# Patient Record
Sex: Male | Born: 1980 | Race: Black or African American | Hispanic: No
Health system: Southern US, Community
[De-identification: ages and names within clinical notes are randomized; demographics above are authoritative.]

## PROBLEM LIST (undated history)

## (undated) HISTORY — PX: HERNIA REPAIR: SHX51

---

## 2011-08-25 ENCOUNTER — Ambulatory Visit: Payer: Worker's Compensation

## 2011-08-31 ENCOUNTER — Ambulatory Visit: Payer: Worker's Compensation

## 2011-09-07 ENCOUNTER — Ambulatory Visit: Payer: Worker's Compensation

## 2011-11-30 ENCOUNTER — Other Ambulatory Visit: Payer: Self-pay | Admitting: Family Medicine

## 2011-11-30 LAB — HEPATITIS B SURFACE ANTIGEN: Hepatitis B Surface Ag: NEGATIVE

## 2011-12-06 ENCOUNTER — Encounter: Payer: Self-pay | Admitting: Family Medicine

## 2012-01-23 ENCOUNTER — Ambulatory Visit
Admission: RE | Admit: 2012-01-23 | Discharge: 2012-01-23 | Disposition: A | Discharge status: Home / Self Care | Payer: 59 | Source: Ambulatory Visit | Attending: Family Medicine | Admitting: Family Medicine

## 2012-01-23 ENCOUNTER — Other Ambulatory Visit: Payer: Self-pay | Admitting: Family Medicine

## 2012-01-23 DIAGNOSIS — S63509A Unspecified sprain of unspecified wrist, initial encounter: Secondary | ICD-10-CM

## 2013-05-30 IMAGING — CR DG WRIST COMPLETE 3+V*R*
2 series · 2 of 2 positions shown · non-contrast
Comparison: None

CLINICAL DATA: Right wrist pain.

RIGHT WRIST - COMPLETE 3+ VIEW

[view not recorded (1 of 2)]
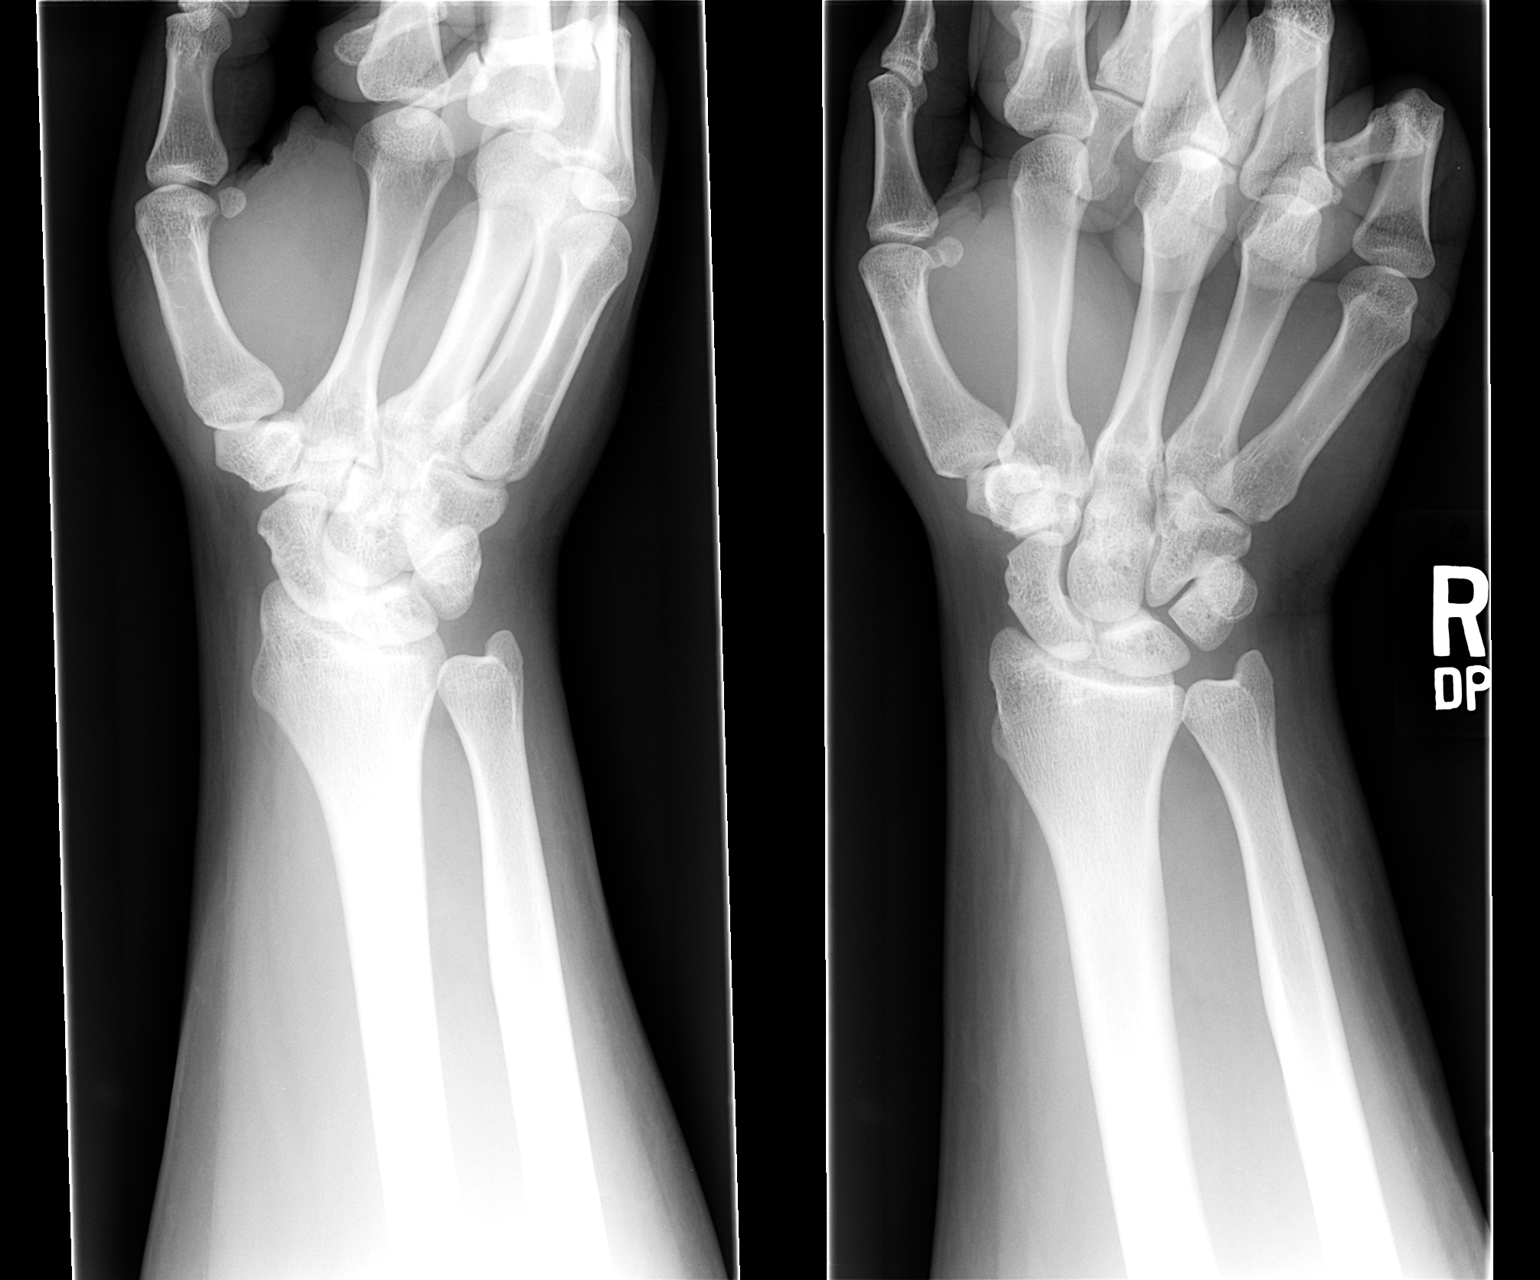

[view not recorded (2 of 2)]
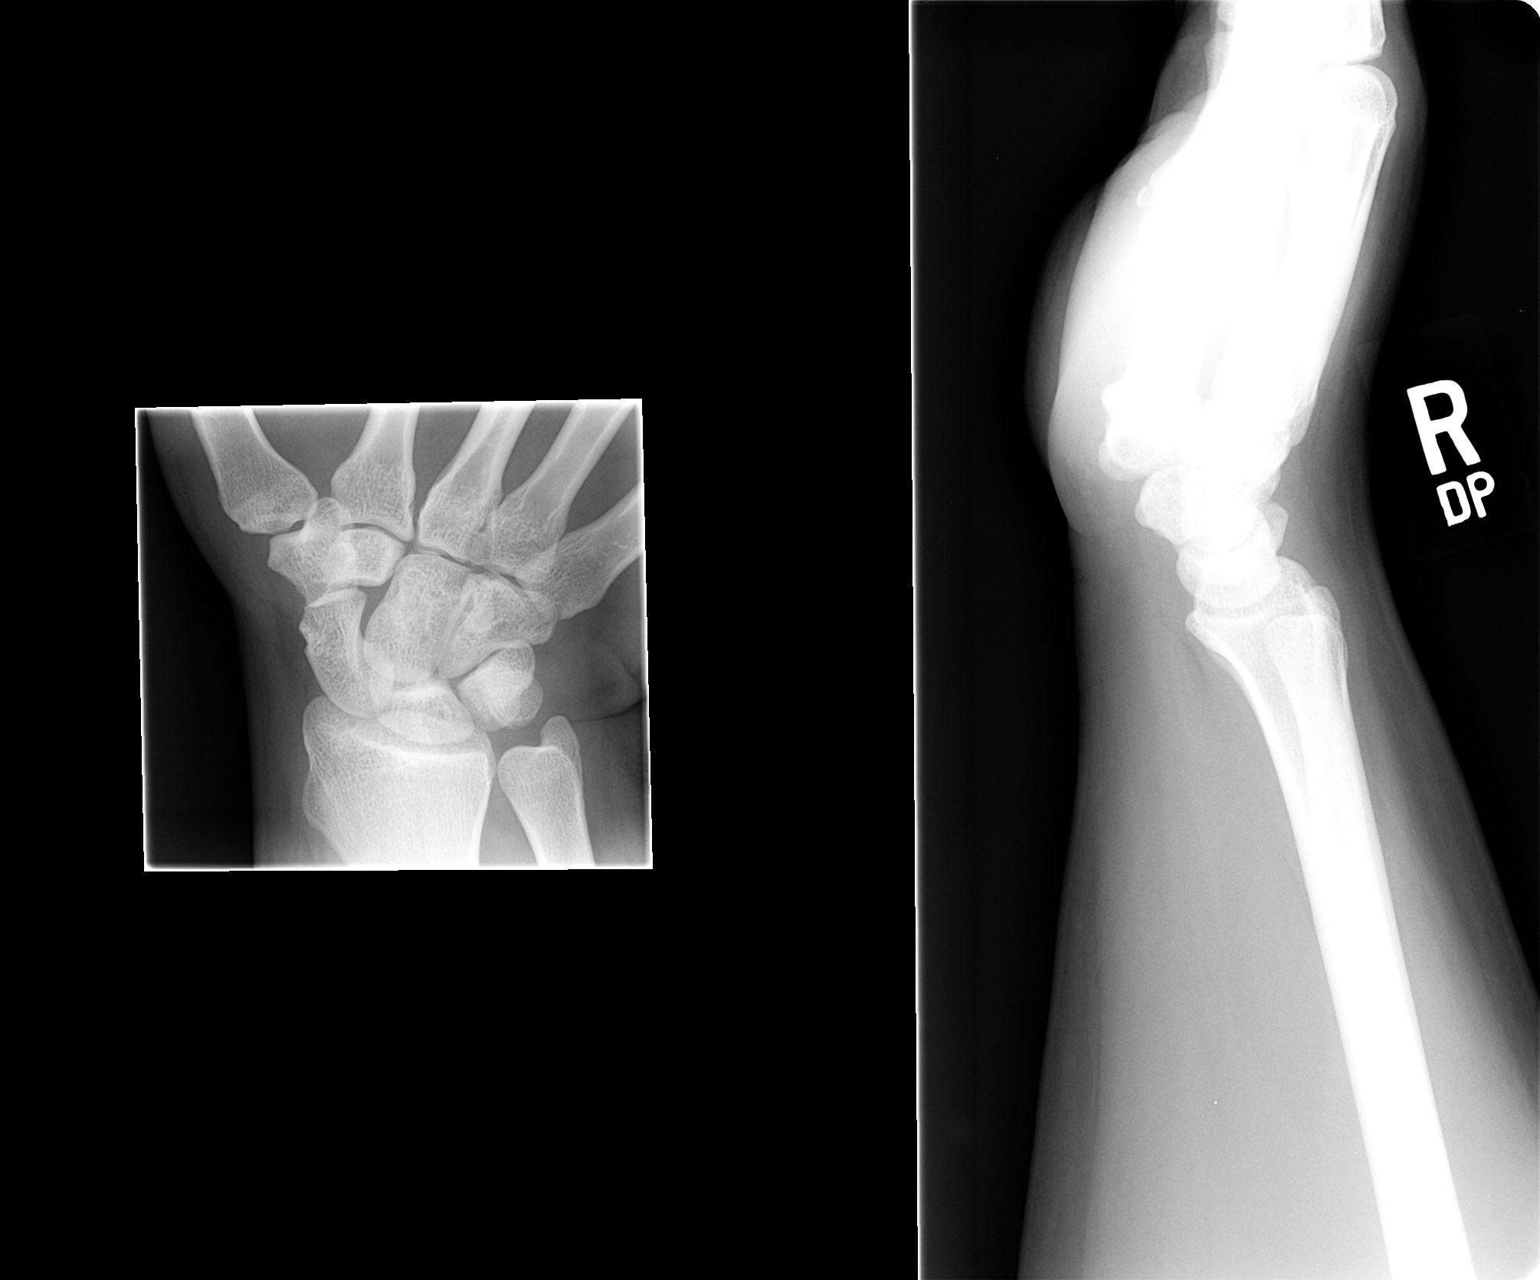

[2 of 2 positions shown; findings below may reference images not displayed]

FINDINGS: There are mild cystic type degenerative changes involving
the carpal bones.  The joint spaces are maintained.  No acute
fracture.
IMPRESSION: No acute bony findings.

## 2014-06-02 ENCOUNTER — Ambulatory Visit
Admission: RE | Admit: 2014-06-02 | Discharge: 2014-06-02 | Disposition: A | Discharge status: Home / Self Care | Payer: 59 | Source: Ambulatory Visit | Attending: Family Medicine | Admitting: Family Medicine

## 2014-06-02 ENCOUNTER — Other Ambulatory Visit: Payer: Self-pay | Admitting: Family Medicine

## 2014-06-02 DIAGNOSIS — R05 Cough: Secondary | ICD-10-CM

## 2014-06-02 DIAGNOSIS — R059 Cough, unspecified: Secondary | ICD-10-CM

## 2015-10-08 IMAGING — CR DG CHEST 2V
2 series · 2 of 2 positions shown · non-contrast
Comparison: None.

CLINICAL DATA: Cough for 2 months.  No fever.

EXAM:
CHEST  2 VIEW

[view not recorded (1 of 2)]
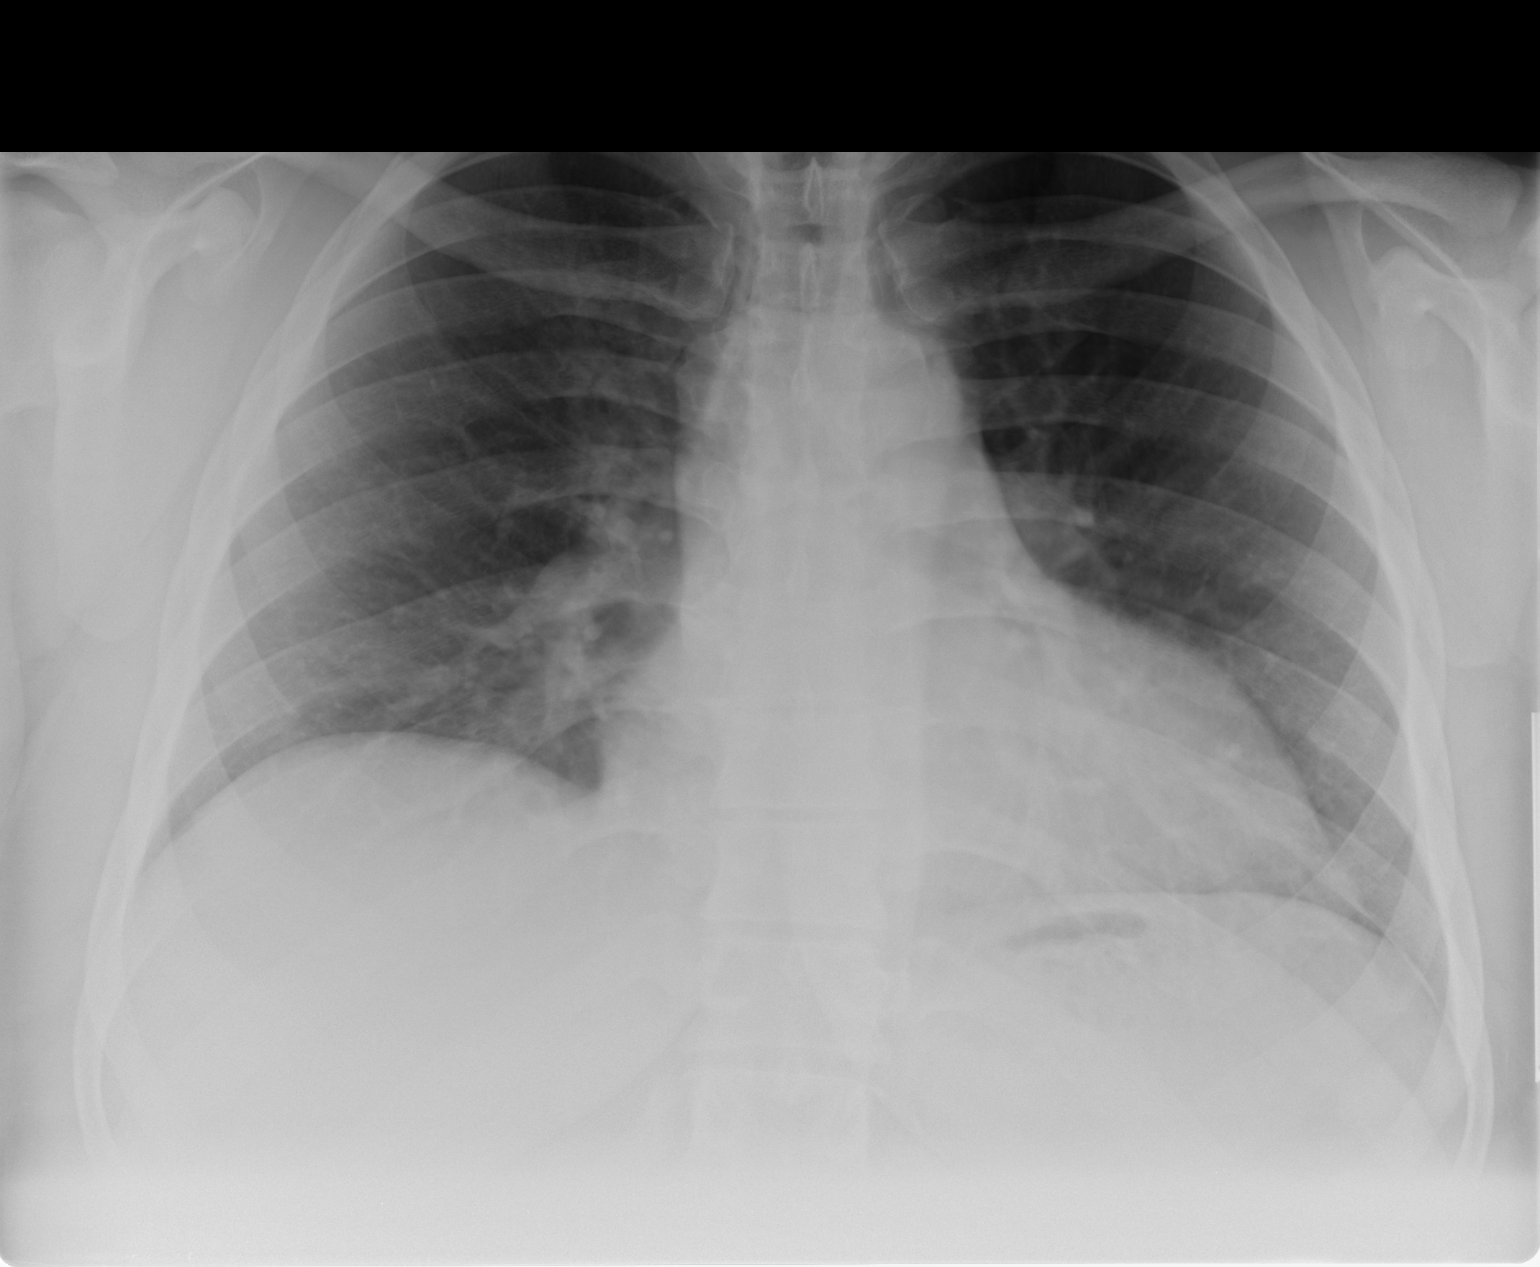

[view not recorded (2 of 2)]
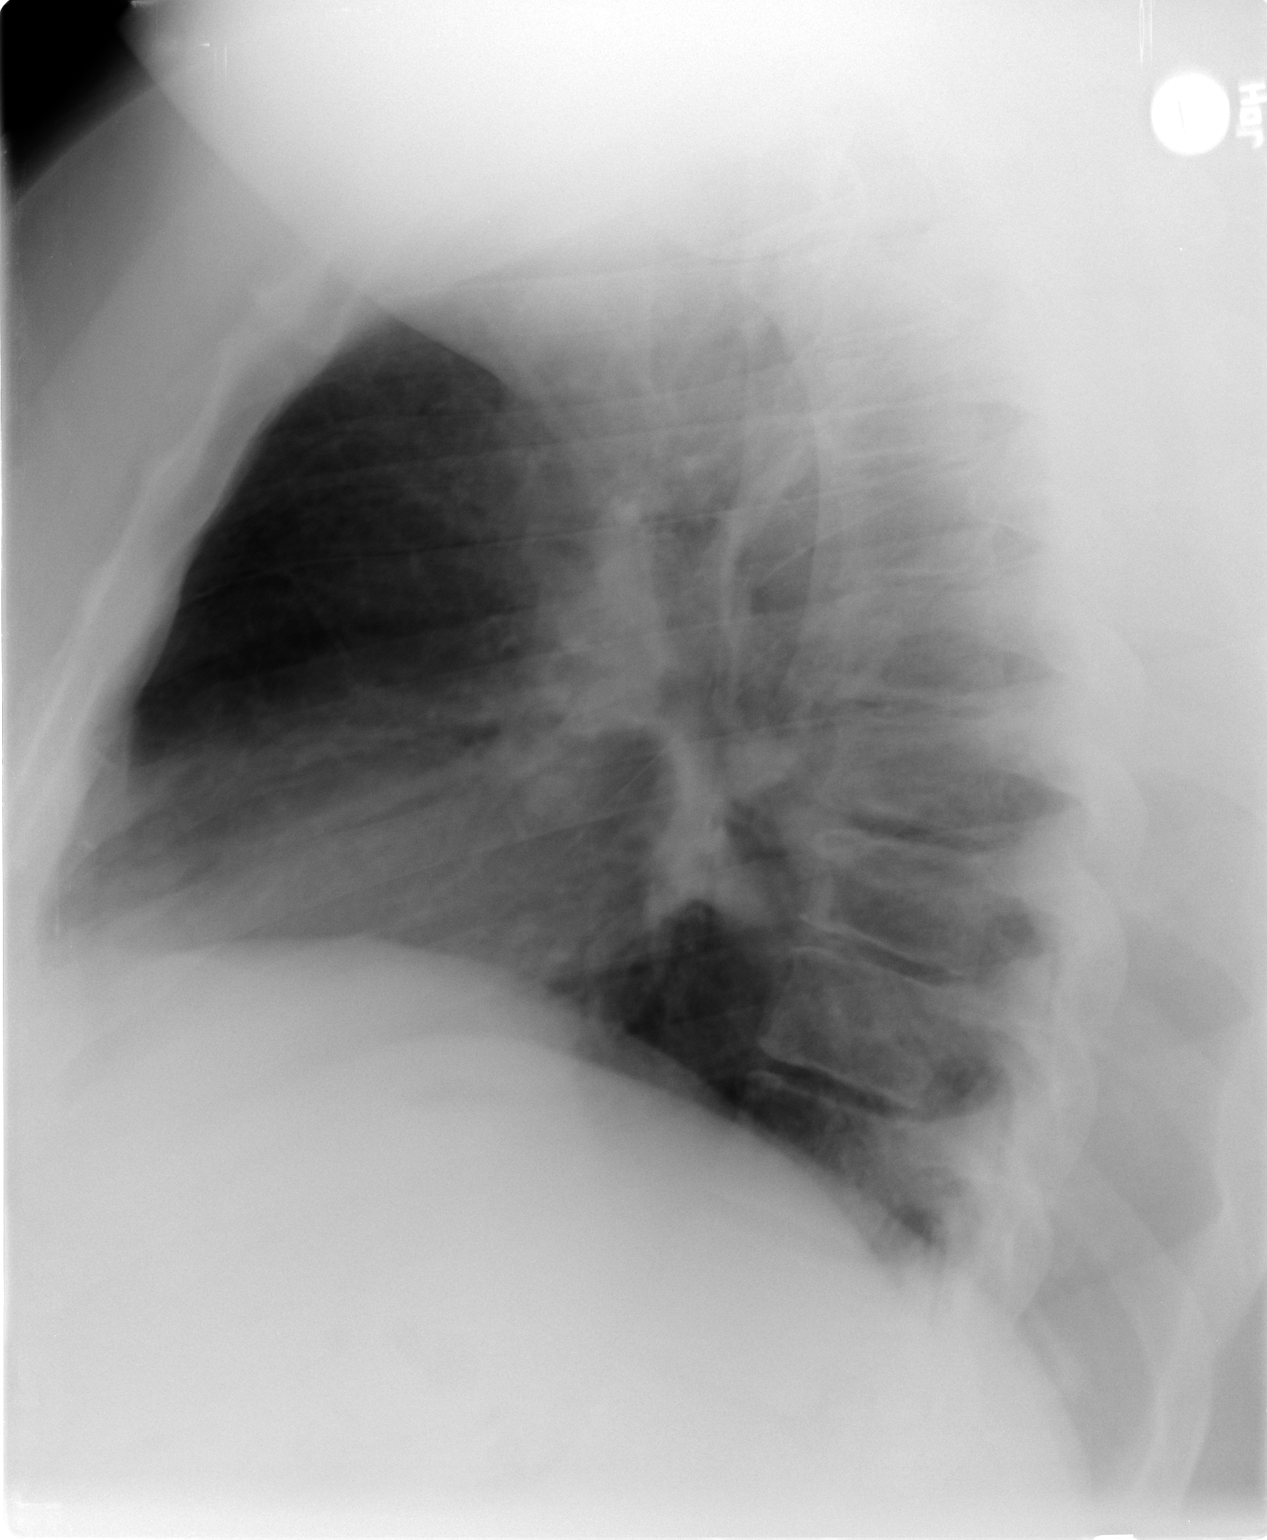

[2 of 2 positions shown; findings below may reference images not displayed]

FINDINGS: Lungs are hypoinflated without consolidation or effusion. There is
mild prominence of the cardiac silhouette likely due to the degree
of hypoinflation. There is minimal spondylosis of the spine.
IMPRESSION: Hypoinflation without acute cardiopulmonary disease.

## 2016-09-06 DIAGNOSIS — M79672 Pain in left foot: Secondary | ICD-10-CM | POA: Diagnosis not present

## 2016-09-06 DIAGNOSIS — J329 Chronic sinusitis, unspecified: Secondary | ICD-10-CM | POA: Diagnosis not present

## 2016-09-13 DIAGNOSIS — L309 Dermatitis, unspecified: Secondary | ICD-10-CM | POA: Diagnosis not present

## 2016-09-13 DIAGNOSIS — M79672 Pain in left foot: Secondary | ICD-10-CM | POA: Diagnosis not present

## 2017-08-09 DIAGNOSIS — J0140 Acute pansinusitis, unspecified: Secondary | ICD-10-CM | POA: Diagnosis not present

## 2017-08-18 DIAGNOSIS — J019 Acute sinusitis, unspecified: Secondary | ICD-10-CM | POA: Diagnosis not present

## 2017-09-20 DIAGNOSIS — J019 Acute sinusitis, unspecified: Secondary | ICD-10-CM | POA: Diagnosis not present

## 2018-06-12 DIAGNOSIS — Z0100 Encounter for examination of eyes and vision without abnormal findings: Secondary | ICD-10-CM | POA: Diagnosis not present

## 2018-09-08 DIAGNOSIS — R509 Fever, unspecified: Secondary | ICD-10-CM | POA: Diagnosis not present

## 2018-09-08 DIAGNOSIS — J111 Influenza due to unidentified influenza virus with other respiratory manifestations: Secondary | ICD-10-CM | POA: Diagnosis not present

## 2018-09-26 DIAGNOSIS — R05 Cough: Secondary | ICD-10-CM | POA: Diagnosis not present

## 2019-06-07 ENCOUNTER — Emergency Department (HOSPITAL_BASED_OUTPATIENT_CLINIC_OR_DEPARTMENT_OTHER)
Admission: EM | Admit: 2019-06-07 | Discharge: 2019-06-07 | Disposition: A | Discharge status: Home / Self Care | Payer: 59 | Attending: Emergency Medicine | Admitting: Emergency Medicine

## 2019-06-07 ENCOUNTER — Emergency Department (HOSPITAL_BASED_OUTPATIENT_CLINIC_OR_DEPARTMENT_OTHER): Payer: 59

## 2019-06-07 ENCOUNTER — Encounter (HOSPITAL_BASED_OUTPATIENT_CLINIC_OR_DEPARTMENT_OTHER): Payer: Self-pay

## 2019-06-07 ENCOUNTER — Other Ambulatory Visit: Payer: Self-pay

## 2019-06-07 DIAGNOSIS — N390 Urinary tract infection, site not specified: Secondary | ICD-10-CM | POA: Diagnosis not present

## 2019-06-07 DIAGNOSIS — R509 Fever, unspecified: Secondary | ICD-10-CM | POA: Diagnosis not present

## 2019-06-07 DIAGNOSIS — R319 Hematuria, unspecified: Secondary | ICD-10-CM | POA: Insufficient documentation

## 2019-06-07 DIAGNOSIS — R1110 Vomiting, unspecified: Secondary | ICD-10-CM | POA: Diagnosis present

## 2019-06-07 DIAGNOSIS — R350 Frequency of micturition: Secondary | ICD-10-CM | POA: Insufficient documentation

## 2019-06-07 LAB — URINALYSIS, ROUTINE W REFLEX MICROSCOPIC
Glucose, UA: 100 mg/dL — AB
Ketones, ur: 15 mg/dL — AB
Nitrite: POSITIVE — AB
Protein, ur: >300 mg/dL — AB
Specific Gravity, Urine: 1.02 (ref 1.005–1.030)
pH: 6.5 (ref 5.0–8.0)

## 2019-06-07 LAB — CBC
HCT: 40 % (ref 39.0–52.0)
Hemoglobin: 14 g/dL (ref 13.0–17.0)
MCH: 28.6 pg (ref 26.0–34.0)
MCHC: 35 g/dL (ref 30.0–36.0)
MCV: 81.6 fL (ref 80.0–100.0)
Platelets: 452 10*3/uL — ABNORMAL HIGH (ref 150–400)
RBC: 4.9 MIL/uL (ref 4.22–5.81)
RDW: 14.8 % (ref 11.5–15.5)
WBC: 19.4 10*3/uL — ABNORMAL HIGH (ref 4.0–10.5)
nRBC: 0 % (ref 0.0–0.2)

## 2019-06-07 LAB — BASIC METABOLIC PANEL
Anion gap: 10 (ref 5–15)
BUN: 8 mg/dL (ref 6–20)
CO2: 27 mmol/L (ref 22–32)
Calcium: 9.1 mg/dL (ref 8.9–10.3)
Chloride: 100 mmol/L (ref 98–111)
Creatinine, Ser: 1.05 mg/dL (ref 0.61–1.24)
GFR calc Af Amer: >60 mL/min (ref >60)
GFR calc non Af Amer: >60 mL/min (ref >60)
Glucose, Bld: 106 mg/dL — ABNORMAL HIGH (ref 70–99)
Potassium: 3.6 mmol/L (ref 3.5–5.1)
Sodium: 137 mmol/L (ref 135–145)

## 2019-06-07 LAB — DIFFERENTIAL
Abs Immature Granulocytes: 0.11 10*3/uL — ABNORMAL HIGH (ref 0.00–0.07)
Basophils Absolute: 0.1 10*3/uL (ref 0.0–0.1)
Basophils Relative: 0 %
Eosinophils Absolute: 0.1 10*3/uL (ref 0.0–0.5)
Eosinophils Relative: 0 %
Immature Granulocytes: 0 %
Lymphocytes Relative: 14 %
Lymphs Abs: 2.8 10*3/uL (ref 0.7–4.0)
Monocytes Absolute: 1.9 10*3/uL — ABNORMAL HIGH (ref 0.1–1.0)
Monocytes Relative: 10 %
Neutro Abs: 14.5 10*3/uL — ABNORMAL HIGH (ref 1.7–7.7)
Neutrophils Relative %: 75 %

## 2019-06-07 LAB — HEPATIC FUNCTION PANEL
ALT: 24 U/L (ref 0–44)
AST: 16 U/L (ref 15–41)
Albumin: 4.2 g/dL (ref 3.5–5.0)
Alkaline Phosphatase: 62 U/L (ref 38–126)
Bilirubin, Direct: 0.2 mg/dL (ref 0.0–0.2)
Indirect Bilirubin: 0.7 mg/dL (ref 0.3–0.9)
Total Bilirubin: 0.9 mg/dL (ref 0.3–1.2)
Total Protein: 8.1 g/dL (ref 6.5–8.1)

## 2019-06-07 LAB — URINALYSIS, MICROSCOPIC (REFLEX)
RBC / HPF: >50 RBC/hpf (ref 0–5)
WBC, UA: >50 WBC/hpf (ref 0–5)

## 2019-06-07 LAB — LIPASE, BLOOD: Lipase: 19 U/L (ref 11–51)

## 2019-06-07 MED ORDER — SODIUM CHLORIDE 0.9 % IV BOLUS
1000.0000 mL | Freq: Once | INTRAVENOUS | Status: AC
  Administered 2019-06-07: 1000 mL via INTRAVENOUS

## 2019-06-07 MED ORDER — SODIUM CHLORIDE 0.9 % IV SOLN
1.0000 g | Freq: Once | INTRAVENOUS | Status: AC
  Administered 2019-06-07: 1 g via INTRAVENOUS
  Filled 2019-06-07: qty 10

## 2019-06-07 MED ORDER — ONDANSETRON 4 MG PO TBDP: 4.0000 mg | ORAL_TABLET | Freq: Three times a day (TID) | ORAL | 0 refills | Status: AC | PRN

## 2019-06-07 MED ORDER — ONDANSETRON HCL 4 MG/2ML IJ SOLN
4.0000 mg | Freq: Once | INTRAMUSCULAR | Status: AC
  Administered 2019-06-07: 4 mg via INTRAVENOUS
  Filled 2019-06-07: qty 2

## 2019-06-07 MED ORDER — ACETAMINOPHEN 500 MG PO TABS
1000.0000 mg | ORAL_TABLET | Freq: Once | ORAL | Status: AC
  Administered 2019-06-07: 18:00:00 1000 mg via ORAL
  Filled 2019-06-07: qty 2

## 2019-06-07 MED ORDER — CEFPODOXIME PROXETIL 200 MG PO TABS: 200.0000 mg | ORAL_TABLET | Freq: Two times a day (BID) | ORAL | 0 refills | Status: AC

## 2019-06-07 MED ORDER — SODIUM CHLORIDE 0.9 % IV SOLN
INTRAVENOUS | Status: DC | PRN
  Administered 2019-06-07: 17:00:00 via INTRAVENOUS

## 2019-06-07 NOTE — Discharge Instructions (Addendum)
Please take full course of antibiotics as prescribed. Please continue fluids and eat.  Please use Zofran as prescribed for nausea if needed.  Please follow-up with primary care within the week for reassessment.  Please return if you have any worsening symptoms.  Please return for new or concerning symptoms as discussed.

## 2019-06-07 NOTE — ED Provider Notes (Signed)
[facility] EMERGENCY DEPARTMENT Provider Note   CSN: [id number] Arrival date & time: 06/07/19  1448     History   Chief Complaint Chief Complaint  Patient presents with  . Urinary Tract Infection    HPI Michael Gordon is a 38 y.o. male with no past medical history no history of urinary tract infections.  Patient presents for fevers last night and 2 episodes of vomiting with a T-max of 101 patient states that he was seen by PCP yesterday and diagnosed with urinary tract infection treated with doxycycline and taken 2 doses but felt much worse last night.  Patient states that initial symptoms began on Friday with blood in his urine and frequency which increased to hourly urination with a pinching sensation.  Patient endorses pelvic fullness denies any abdominal pain.    Denies flank pain, chest pain, shortness of breath.     HPI  History reviewed. No pertinent past medical history.  There are no active problems to display for this patient.   Past Surgical History:  Procedure Laterality Date  . HERNIA REPAIR          Home Medications    Prior to Admission medications   Medication Sig Start Date End Date Taking? Authorizing Provider  cefpodoxime (VANTIN) 200 MG tablet Take 1 tablet (200 mg total) by mouth 2 (two) times daily for 10 days. 06/07/19 06/17/19  Gailen Shelter, PA  ondansetron (ZOFRAN ODT) 4 MG disintegrating tablet Take 1 tablet (4 mg total) by mouth every 8 (eight) hours as needed for nausea or vomiting. 06/07/19   Gailen Shelter, PA    Family History No family history on file.  Social History Social History   Tobacco Use  . Smoking status: Never Smoker  . Smokeless tobacco: Never Used  Substance Use Topics  . Alcohol use: Never    Frequency: Never  . Drug use: Never     Allergies   Patient has no known allergies.   Review of Systems Review of Systems  All other systems reviewed and are negative.    Physical Exam Updated  Vital Signs BP 138/84 (BP Location: Left Arm)   Pulse 90   Temp 99.7 F (37.6 C) (Oral)   Resp 16   Ht 6' (1.829 m)   Wt (!) 185.1 kg   SpO2 95%   BMI 55.33 kg/m   Physical Exam Vitals signs and nursing note reviewed.  Constitutional:      General: He is not in acute distress.    Appearance: Normal appearance. He is obese. He is not ill-appearing or toxic-appearing.     Comments: Is well-appearing.  Using computer and hospital bed to assignment.  Attentive to questions, follows commands.  HENT:     Head: Normocephalic and atraumatic.     Mouth/Throat:     Mouth: Mucous membranes are moist.  Eyes:     General: No scleral icterus. Neck:     Musculoskeletal: No neck rigidity.  Cardiovascular:     Rate and Rhythm: Normal rate and regular rhythm.     Pulses: Normal pulses.     Heart sounds: Normal heart sounds.  Pulmonary:     Effort: Pulmonary effort is normal.     Breath sounds: Normal breath sounds.  Abdominal:     General: Abdomen is flat. Bowel sounds are normal. There is no distension.     Palpations: Abdomen is soft.     Tenderness: There is no abdominal tenderness. There is no right  CVA tenderness, left CVA tenderness, guarding or rebound.  Musculoskeletal:     Right lower leg: No edema.     Left lower leg: No edema.  Skin:    General: Skin is warm and dry.     Capillary Refill: Capillary refill takes less than 2 seconds.  Neurological:     Mental Status: He is alert. Mental status is at baseline.  Psychiatric:        Behavior: Behavior normal.      ED Treatments / Results  Labs (all labs ordered are listed, but only abnormal results are displayed) Labs Reviewed  URINALYSIS, ROUTINE W REFLEX MICROSCOPIC - Abnormal; Notable for the following components:      Result Value   Color, Urine ORANGE (*)    APPearance CLOUDY (*)    Glucose, UA 100 (*)    Hgb urine dipstick LARGE (*)    Bilirubin Urine SMALL (*)    Ketones, ur 15 (*)    Protein, ur >300 (*)     Nitrite POSITIVE (*)    Leukocytes,Ua MODERATE (*)    All other components within normal limits  CBC - Abnormal; Notable for the following components:   WBC 19.4 (*)    Platelets 452 (*)    All other components within normal limits  URINALYSIS, MICROSCOPIC (REFLEX) - Abnormal; Notable for the following components:   Bacteria, UA MANY (*)    Non Squamous Epithelial PRESENT (*)    All other components within normal limits  BASIC METABOLIC PANEL - Abnormal; Notable for the following components:   Glucose, Bld 106 (*)    All other components within normal limits  DIFFERENTIAL - Abnormal; Notable for the following components:   Neutro Abs 14.5 (*)    Monocytes Absolute 1.9 (*)    Abs Immature Granulocytes 0.11 (*)    All other components within normal limits  URINE CULTURE  HEPATIC FUNCTION PANEL  LIPASE, BLOOD    EKG None  Radiology Ct Renal Stone Study  Result Date: 06/07/2019 CLINICAL DATA:  38 year old male with acute abdominal and pelvic pain, fever and hematuria. EXAM: CT ABDOMEN AND PELVIS WITHOUT CONTRAST TECHNIQUE: Multidetector CT imaging of the abdomen and pelvis was performed following the standard protocol without IV contrast. COMPARISON:  None. FINDINGS: Please note that parenchymal abnormalities may be missed without intravenous contrast. Lower chest: No acute abnormality. Hepatobiliary: No significant hepatic or gallbladder abnormalities. No biliary dilatation. Pancreas: Unremarkable Spleen: Unremarkable Adrenals/Urinary Tract: Equivocal mild circumferential bladder wall thickening and mild adjacent inflammation may represent cystitis. The kidneys and adrenal glands are unremarkable. No hydronephrosis or urinary calculi. Stomach/Bowel: Stomach is within normal limits. Appendix appears normal. No evidence of bowel wall thickening, distention, or inflammatory changes. Vascular/Lymphatic: No significant vascular findings are present. No enlarged abdominal or pelvic lymph nodes.  Reproductive: Prostate is unremarkable. Other: No ascites, focal collection or pneumoperitoneum. RIGHT inguinal hernia repair changes noted. Musculoskeletal: No acute or suspicious bony abnormalities. IMPRESSION: 1. Equivocal mild circumferential bladder wall thickening which may represent cystitis. Correlate clinically. 2. No other acute abnormality noted. No evidence of hydronephrosis or urinary calculi. Electronically Signed   By: Margarette Canada M.D.   On: 06/07/2019 17:18    Procedures Procedures (including critical care time)  Medications Ordered in ED Medications  ondansetron (ZOFRAN) injection 4 mg (4 mg Intravenous Given 06/07/19 1635)  sodium chloride 0.9 % bolus 1,000 mL ( Intravenous Stopped 06/07/19 1746)  cefTRIAXone (ROCEPHIN) 1 g in sodium chloride 0.9 % 100 mL IVPB (  Intravenous Stopped 06/07/19 1741)  acetaminophen (TYLENOL) tablet 1,000 mg (1,000 mg Oral Given 06/07/19 1758)     Initial Impression / Assessment and Plan / ED Course  I have reviewed the triage vital signs and the nursing notes.  Pertinent labs & imaging results that were available during my care of the patient were reviewed by me and considered in my medical decision making (see chart for details).        3 no history of urinary tract infections presents for 2 episodes of vomiting and fever last night along with dysuria, frequency, urgency.  Patient is being treated for UTI with doxycycline and had taken to doses so far.   Patient states he has taken azo and UA is abnormal likely as a result of this.  Patient has many bacteria present in urine along with evidence of contamination of epithelial cells.  However patient has positive nitrates leukocytes symptoms of UTI.  Will treat empirically with 1 course of ceftriaxone 1 g and send patient home with oxime and Zofran for nausea.  Patient with WBC 19.4 is neutrophil predominant.  CT renal stone study shows evidence of cystitis with no renal stones or evidence of  pyelonephritis.    Patient has taken ibuprofen today with initially had a temperature within normal limits but mild elevation at 9.7 during ED visit.  Patient successfully fluid challenged during ED visit.  Sent home with oral biotics and strict return precautions.  Patient appears well at time of discharge and has normal vitals.  We will continues to use Tylenol and ibuprofen for fever and discomfort at home and follow-up with PCP.  Understands return precautions and states he will monitor his symptoms cautiously.     The patient appears reasonably screened and/or stabilized for discharge and I doubt any other medical condition or other [facility] requiring further screening, evaluation, or treatment in the ED at this time prior to discharge.  Patient is hemodynamically stable, in NAD, and able to ambulate in the ED. Pain has been managed or a plan has been made for home management and has no complaints prior to discharge. Patient is comfortable with above plan and is stable for discharge at this time. All questions were answered prior to disposition. Results from the ER workup discussed with the patient face to face and all questions answered to the best of my ability. The patient is safe for discharge with strict return precautions. Patient appears safe for discharge with appropriate follow-up.  Conveyed my impression with the patient and he voiced understanding and is agreeable to plan.   An After Visit Summary was printed and given to the patient.  Portions of this note were generated with Scientist, clinical (histocompatibility and immunogenetics)Dragon dictation software. Dictation errors may occur despite best attempts at proofreading.      Final Clinical Impressions(s) / ED Diagnoses   Final diagnoses:  Lower urinary tract infectious disease    ED Discharge Orders         Ordered    cefpodoxime (VANTIN) 200 MG tablet  2 times daily     06/07/19 1740    ondansetron (ZOFRAN ODT) 4 MG disintegrating tablet  Every 8 hours PRN     06/07/19 1741            Gailen ShelterFondaw, Dominyck Reser S, PA 06/08/19 0036    Virgina Norfolkuratolo, Adam, DO 06/08/19 1715

## 2019-06-07 NOTE — ED Triage Notes (Signed)
Pt states he was dx with a UTI last PM at [facility]. Pt was placed on an abx and pain medication. Over night pt had increased increased pain, fever, and HA. Pt called PCP and was told to come to the ED for IV abx. TMax 101

## 2019-06-07 NOTE — ED Notes (Signed)
Patient transported to CT 

## 2019-06-07 NOTE — ED Notes (Signed)
Cranberry juice given for PO challenge.

## 2019-06-09 LAB — URINE CULTURE: Culture: >=100000 — AB

## 2019-06-10 ENCOUNTER — Telehealth: Payer: Self-pay

## 2019-06-10 NOTE — Telephone Encounter (Signed)
Post ED Visit - Positive Culture Follow-up  Culture report reviewed by antimicrobial stewardship pharmacist: [place] Team []  Elenor Quinones, Pharm.D. []  Heide Guile, Pharm.D., BCPS AQ-ID []  Parks Neptune, Pharm.D., BCPS []  Alycia Rossetti, Pharm.D., BCPS [x]  [place], [place].D., BCPS, AAHIVP []  Legrand Como, Pharm.D., BCPS, AAHIVP []  Salome Arnt, PharmD, BCPS []  Johnnette Gourd, PharmD, BCPS []  Hughes Better, PharmD, BCPS []  Leeroy Cha, PharmD []  Laqueta Linden, PharmD, BCPS []  Albertina Parr, PharmD  [place] Team []  Leodis Sias, PharmD []  Lindell Spar, PharmD []  Royetta Asal, PharmD []  Graylin Shiver, Rph []  Rema Fendt) Glennon Mac, PharmD []  Arlyn Dunning, PharmD []  Netta Cedars, PharmD []  Dia Sitter, PharmD []  Leone Haven, PharmD []  Gretta Arab, PharmD []  Theodis Shove, PharmD []  Peggyann Juba, PharmD []  Reuel Boom, PharmD   Positive urine culture Treated with Cefpodoxime, organism sensitive to the same and no further patient follow-up is required at this time.  Genia Del 06/10/2019, 9:13 AM

## 2019-06-10 NOTE — Telephone Encounter (Signed)
Post ED Visit - Positive Culture Follow-up  Culture report reviewed by antimicrobial stewardship pharmacist: [place] Team []  Elenor Quinones, Pharm.D. []  Heide Guile, Pharm.D., BCPS AQ-ID []  Parks Neptune, Pharm.D., BCPS []  Alycia Rossetti, Pharm.D., BCPS [x]  [place], Pharm.D., BCPS, AAHIVP []  Legrand Como, Pharm.D., BCPS, AAHIVP []  Salome Arnt, PharmD, BCPS []  Johnnette Gourd, PharmD, BCPS []  Hughes Better, PharmD, BCPS []  Leeroy Cha, PharmD []  Laqueta Linden, PharmD, BCPS []  Albertina Parr, PharmD  [place] Team []  Leodis Sias, PharmD []  Lindell Spar, PharmD []  Royetta Asal, PharmD []  Graylin Shiver, Rph []  Rema Fendt) Glennon Mac, PharmD []  Arlyn Dunning, PharmD []  Netta Cedars, PharmD []  Dia Sitter, PharmD []  Leone Haven, PharmD []  Gretta Arab, PharmD []  Theodis Shove, PharmD []  Peggyann Juba, PharmD []  Reuel Boom, PharmD   Positive urine culture Treated with Cefpodoxime, organism sensitive to the same and no further patient follow-up is required at this time.  Genia Del 06/10/2019, 9:06 AM

## 2020-10-12 IMAGING — CT CT RENAL STONE PROTOCOL
2 of 4 series · 17 of 46 positions shown, 19 images · non-contrast
Comparison: None.

CLINICAL DATA: 38-year-old male with acute abdominal and pelvic
pain, fever and hematuria.

EXAM:
CT ABDOMEN AND PELVIS WITHOUT CONTRAST
TECHNIQUE: Multidetector CT imaging of the abdomen and pelvis was performed
following the standard protocol without IV contrast.

[Series 2: axial st · axial · 0.95mm/px · z∈[-637,-197]mm · 14 of 98 slices shown, 16 images]
[im 5/98  soft-tissue]
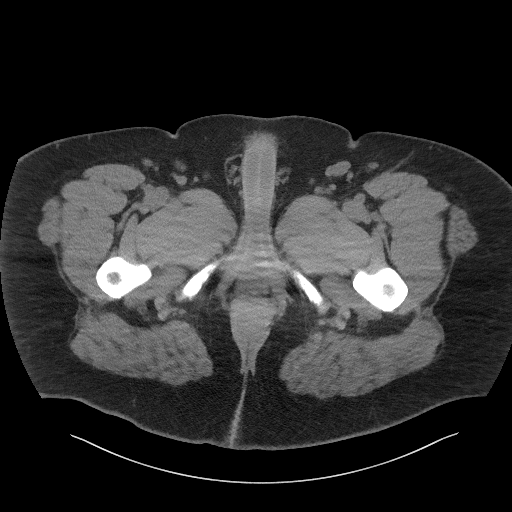
[im 5/98  bone]
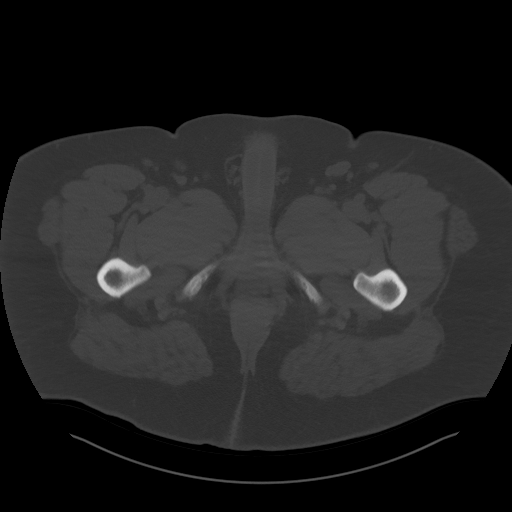
[im 13/98  soft-tissue]
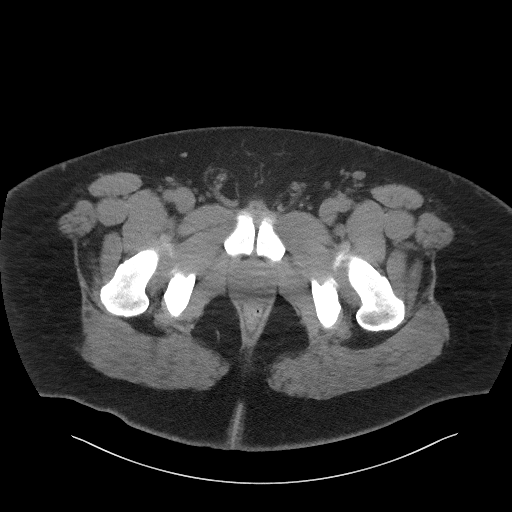
[im 21/98  soft-tissue]
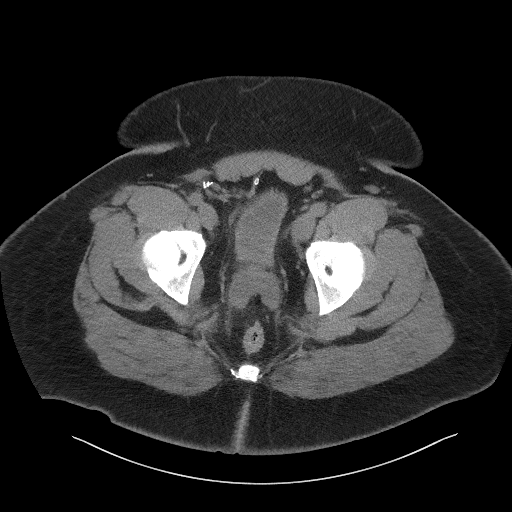
[im 25/98  soft-tissue]
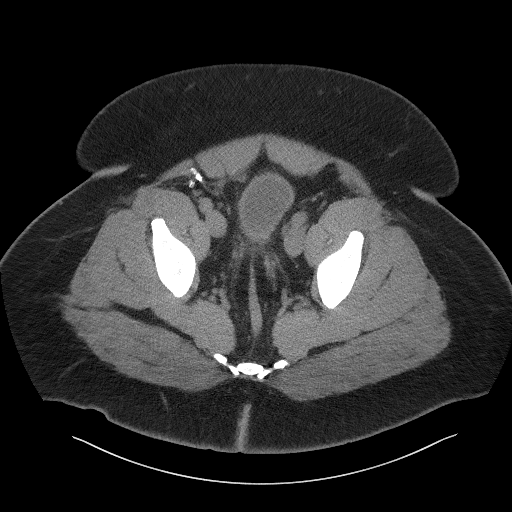
[im 33/98  soft-tissue]
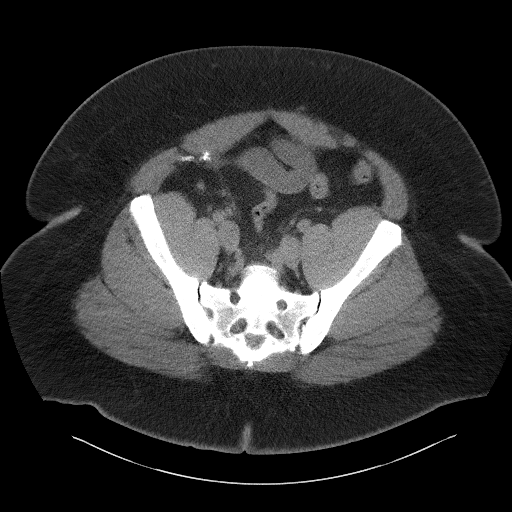
[im 41/98  soft-tissue]
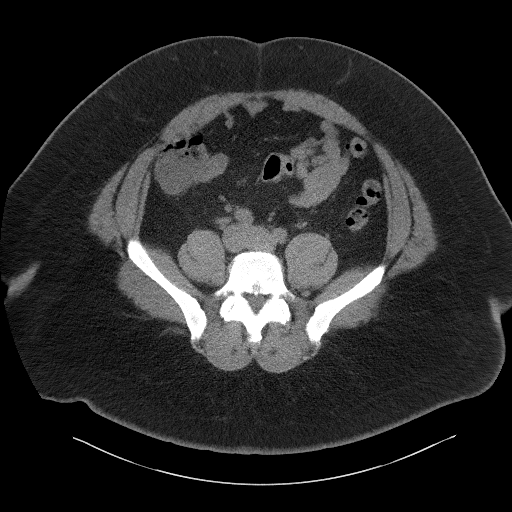
[im 45/98  soft-tissue]
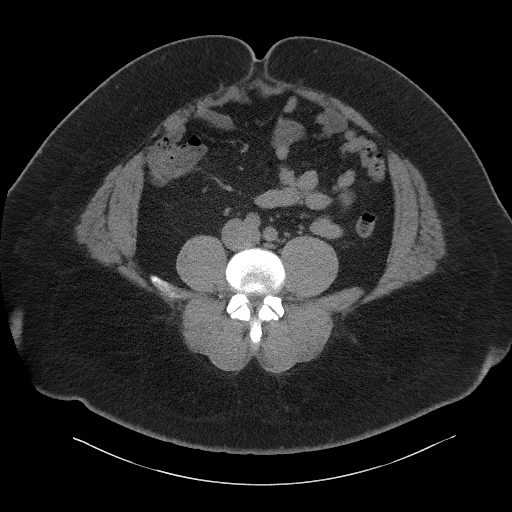
[im 53/98  soft-tissue]
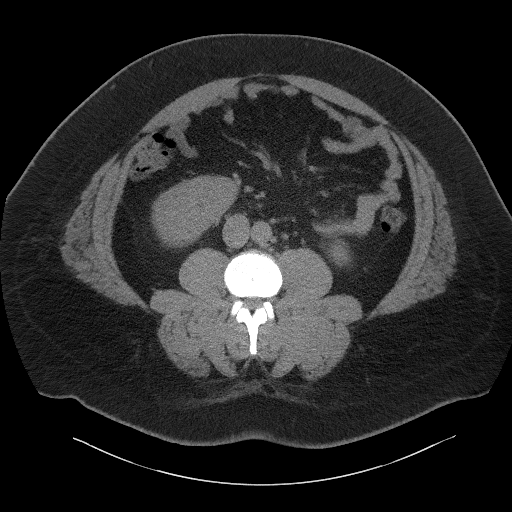
[im 57/98  soft-tissue]
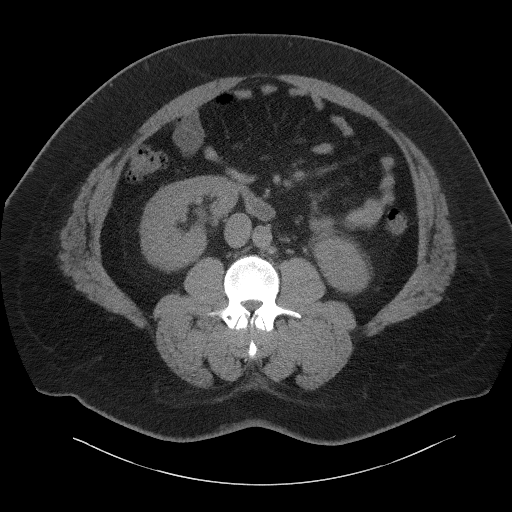
[im 57/98  bone]
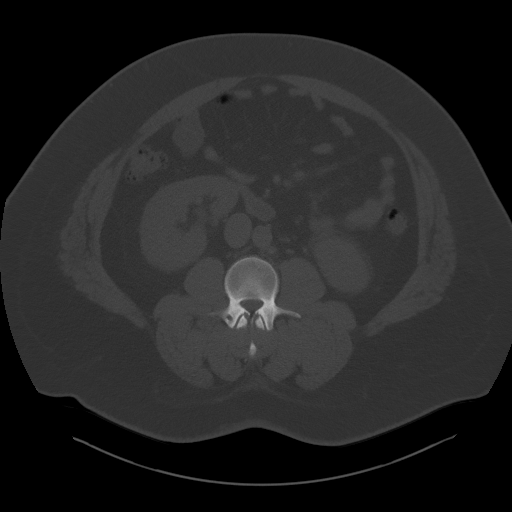
[im 65/98  soft-tissue]
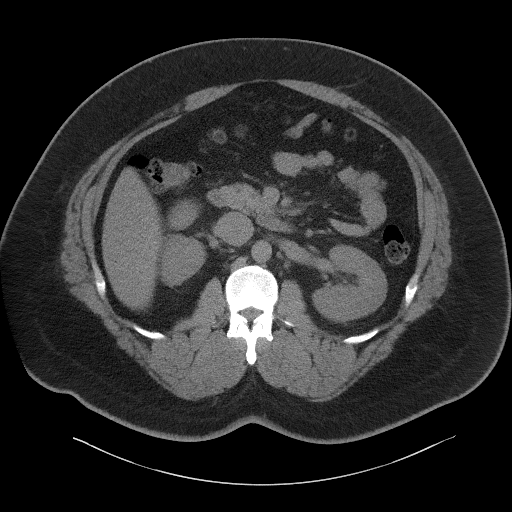
[im 73/98  soft-tissue]
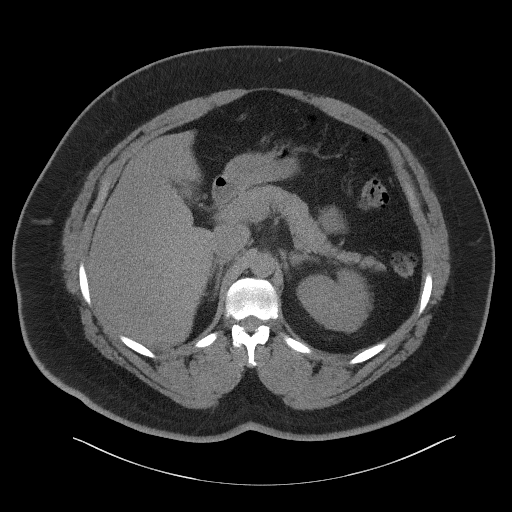
[im 77/98  soft-tissue]
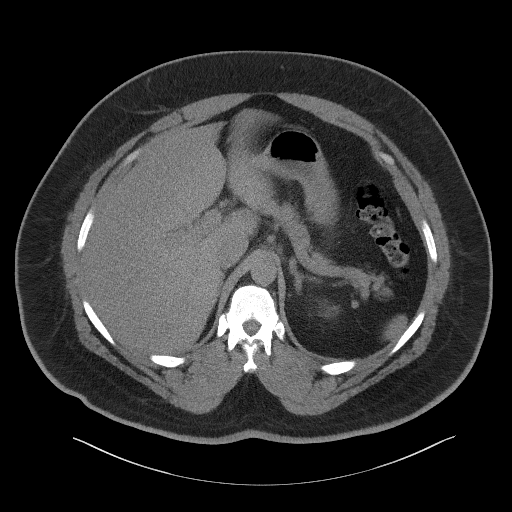
[im 85/98  soft-tissue]
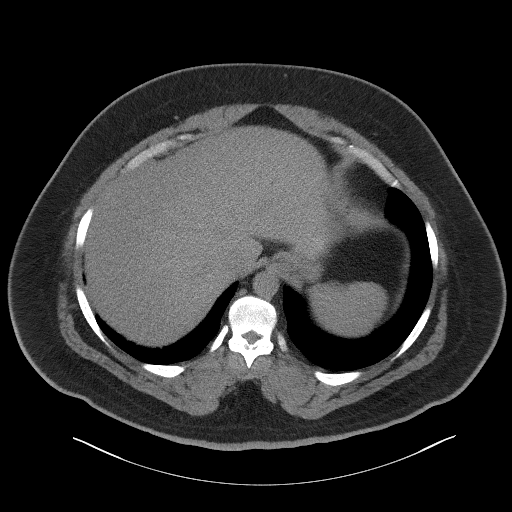
[im 93/98  soft-tissue]
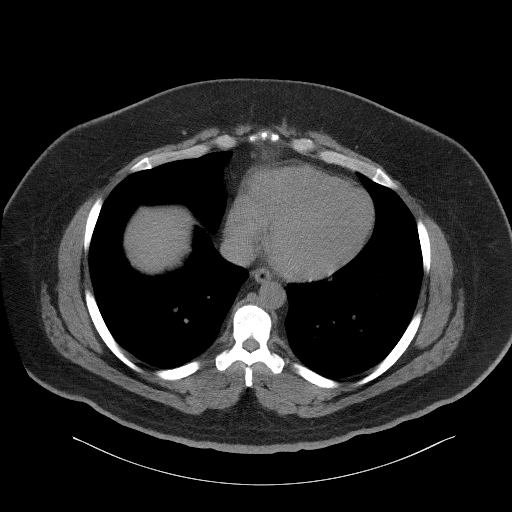

[Series 5: coronal st · coronal · 1.01mm/px · 3 of 132 slices shown]
[im 44/132  soft-tissue]
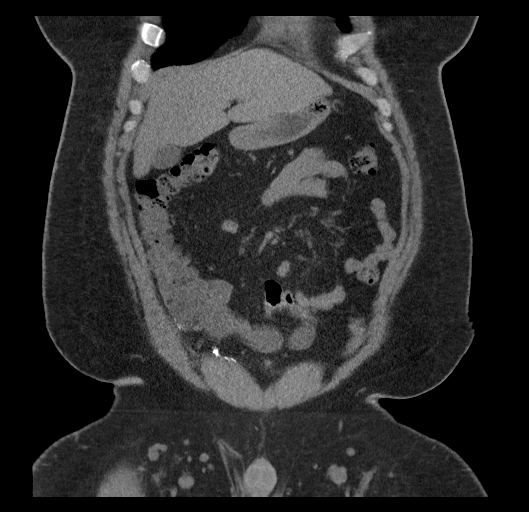
[im 59/132  soft-tissue]
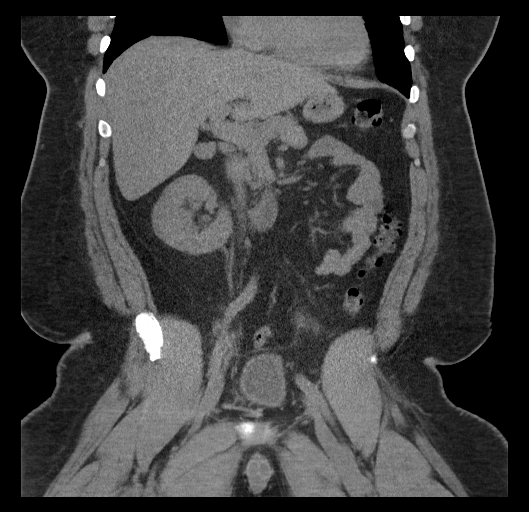
[im 73/132  soft-tissue]
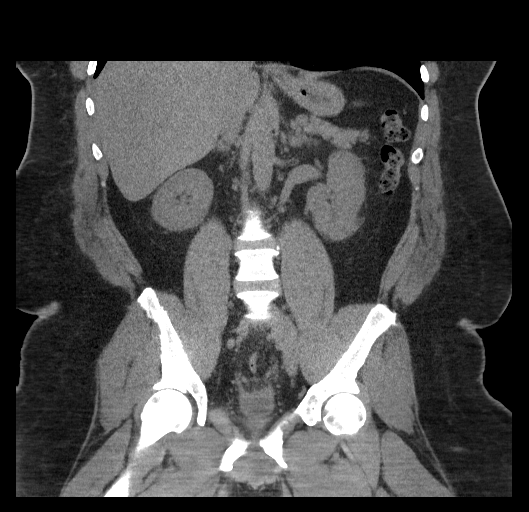

[17 of 46 positions shown; findings below may reference images not displayed]

FINDINGS: Please note that parenchymal abnormalities may be missed without
intravenous contrast.

Lower chest: No acute abnormality.

Hepatobiliary: No significant hepatic or gallbladder abnormalities.
No biliary dilatation.

Pancreas: Unremarkable

Spleen: Unremarkable

Adrenals/Urinary Tract: Equivocal mild circumferential bladder wall
thickening and mild adjacent inflammation may represent cystitis.
The kidneys and adrenal glands are unremarkable. No hydronephrosis
or urinary calculi.

Stomach/Bowel: Stomach is within normal limits. Appendix appears
normal. No evidence of bowel wall thickening, distention, or
inflammatory changes.

Vascular/Lymphatic: No significant vascular findings are present. No
enlarged abdominal or pelvic lymph nodes.

Reproductive: Prostate is unremarkable.

Other: No ascites, focal collection or pneumoperitoneum. RIGHT
inguinal hernia repair changes noted.

Musculoskeletal: No acute or suspicious bony abnormalities.
IMPRESSION: 1. Equivocal mild circumferential bladder wall thickening which may
represent cystitis. Correlate clinically.
2. No other acute abnormality noted. No evidence of hydronephrosis
or urinary calculi.

## 2021-11-23 DIAGNOSIS — Z03818 Encounter for observation for suspected exposure to other biological agents ruled out: Secondary | ICD-10-CM | POA: Diagnosis not present

## 2021-11-23 DIAGNOSIS — R109 Unspecified abdominal pain: Secondary | ICD-10-CM | POA: Diagnosis not present

## 2021-11-23 DIAGNOSIS — R509 Fever, unspecified: Secondary | ICD-10-CM | POA: Diagnosis not present

## 2021-11-23 DIAGNOSIS — R0981 Nasal congestion: Secondary | ICD-10-CM | POA: Diagnosis not present

## 2021-11-23 DIAGNOSIS — R112 Nausea with vomiting, unspecified: Secondary | ICD-10-CM | POA: Diagnosis not present

## 2022-08-29 DIAGNOSIS — R42 Dizziness and giddiness: Secondary | ICD-10-CM | POA: Diagnosis not present

## 2022-08-29 DIAGNOSIS — H65192 Other acute nonsuppurative otitis media, left ear: Secondary | ICD-10-CM | POA: Diagnosis not present
